# Patient Record
Sex: Male | Born: 2000 | Race: Black or African American | Hispanic: No | Marital: Single | Smoking: Never smoker
Health system: Southern US, Community
[De-identification: ages and names within clinical notes are randomized; demographics above are authoritative.]

---
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
## 2020-09-06 ENCOUNTER — Ambulatory Visit
Admission: EM | Admit: 2020-09-06 | Discharge: 2020-09-06 | Disposition: A | Discharge status: Home / Self Care | Payer: BC Managed Care – PPO | Attending: Emergency Medicine | Admitting: Emergency Medicine

## 2020-09-06 ENCOUNTER — Encounter: Payer: Self-pay | Admitting: *Deleted

## 2020-09-06 DIAGNOSIS — J069 Acute upper respiratory infection, unspecified: Secondary | ICD-10-CM

## 2020-09-06 DIAGNOSIS — J029 Acute pharyngitis, unspecified: Secondary | ICD-10-CM

## 2020-09-06 LAB — POCT RAPID STREP A (OFFICE): Rapid Strep A Screen: NEGATIVE

## 2020-09-06 NOTE — Discharge Instructions (Addendum)
Your rapid strep test is negative.  A throat culture is pending; we will call you if it is positive requiring treatment.    Take Tylenol or ibuprofen as needed for discomfort.  Take Robitussin as needed for cough.  Follow up with your primary care provider if your symptoms are not improving.

## 2020-09-06 NOTE — ED Triage Notes (Signed)
Patient reports sore throat x 1 day. Denies nasal congestion or drainage. Denies fever. Reports intermittent cough.

## 2020-09-06 NOTE — ED Provider Notes (Addendum)
Renaldo Fiddler    CSN: [id number] Arrival date & time: 09/06/20  0847      History   Chief Complaint Chief Complaint  Patient presents with  . Sore Throat    HPI Jerry Melton is a 19 y.o. male.   Patient presents with a mild sore throat and cough since yesterday.  He denies fever, chills, rash, nasal congestion, shortness of breath, vomiting, diarrhea, or other symptoms.  Treatment at home yesterday with Tylenol; none today.  He denies pertinent medical history.  The history is provided by the patient.    History reviewed. No pertinent past medical history.  There are no problems to display for this patient.   History reviewed. No pertinent surgical history.     Home Medications    Prior to Admission medications   Not on File    Family History History reviewed. No pertinent family history.  Social History Social History   Tobacco Use  . Smoking status: Never Smoker  . Smokeless tobacco: Never Used  Substance Use Topics  . Alcohol use: Not on file  . Drug use: Not on file     Allergies   Patient has no known allergies.   Review of Systems Review of Systems  Constitutional: Negative for chills and fever.  HENT: Positive for sore throat. Negative for ear pain.   Eyes: Negative for pain and visual disturbance.  Respiratory: Positive for cough. Negative for shortness of breath.   Cardiovascular: Negative for chest pain and palpitations.  Gastrointestinal: Negative for abdominal pain, diarrhea, nausea and vomiting.  Genitourinary: Negative for dysuria and hematuria.  Musculoskeletal: Negative for arthralgias and back pain.  Skin: Negative for color change and rash.  Neurological: Negative for seizures and syncope.  All other systems reviewed and are negative.    Physical Exam Triage Vital Signs ED Triage Vitals  Enc Vitals Group     BP      Pulse      Resp      Temp      Temp src      SpO2      Weight      Height      Head  Circumference      Peak Flow      Pain Score      Pain Loc      Pain Edu?      Excl. in GC?    No data found.  Updated Vital Signs BP 117/73 (BP Location: Right Arm)   Pulse 72   Temp 98.9 F (37.2 C) (Oral)   Resp 16   SpO2 97%   Visual Acuity Right Eye Distance:   Left Eye Distance:   Bilateral Distance:    Right Eye Near:   Left Eye Near:    Bilateral Near:     Physical Exam Vitals and nursing note reviewed.  Constitutional:      General: He is not in acute distress.    Appearance: He is well-developed. He is not ill-appearing.  HENT:     Head: Normocephalic and atraumatic.     Right Ear: Tympanic membrane normal.     Left Ear: Tympanic membrane normal.     Nose: Nose normal.     Mouth/Throat:     Mouth: Mucous membranes are moist.     Pharynx: Oropharynx is clear.  Eyes:     Conjunctiva/sclera: Conjunctivae normal.  Cardiovascular:     Rate and Rhythm: Normal rate and regular rhythm.  Heart sounds: Normal heart sounds.  Pulmonary:     Effort: Pulmonary effort is normal. No respiratory distress.     Breath sounds: Normal breath sounds. No wheezing or rhonchi.  Abdominal:     Palpations: Abdomen is soft.     Tenderness: There is no abdominal tenderness. There is no guarding or rebound.  Musculoskeletal:     Cervical back: Neck supple.  Skin:    General: Skin is warm and dry.     Findings: No rash.  Neurological:     General: No focal deficit present.     Mental Status: He is alert and oriented to person, place, and time.     Gait: Gait normal.  Psychiatric:        Mood and Affect: Mood normal.        Behavior: Behavior normal.      UC Treatments / Results  Labs (all labs ordered are listed, but only abnormal results are displayed) Labs Reviewed  NOVEL CORONAVIRUS, NAA  POCT RAPID STREP A (OFFICE)    EKG   Radiology No results found.  Procedures Procedures (including critical care time)  Medications Ordered in UC Medications - No  data to display  Initial Impression / Assessment and Plan / UC Course  I have reviewed the triage vital signs and the nursing notes.  Pertinent labs & imaging results that were available during my care of the patient were reviewed by me and considered in my medical decision making (see chart for details).   Sore throat, viral URI.  Rapid strep negative; culture pending.  PCR COVID pending. Instructed patient to self quarantine until the test result is back.  Treating symptomatically with Tylenol or ibuprofen as needed for fever or discomfort; Robitussin as needed for cough.  Instructed patient to follow-up with his PCP if his symptoms are not improving.  Patient agrees to plan of care.   Final Clinical Impressions(s) / UC Diagnoses   Final diagnoses:  Sore throat  Viral upper respiratory tract infection     Discharge Instructions     Your rapid strep test is negative.  A throat culture is pending; we will call you if it is positive requiring treatment.    Take Tylenol or ibuprofen as needed for discomfort.  Take Robitussin as needed for cough.  Follow up with your primary care provider if your symptoms are not improving.       ED Prescriptions    None     PDMP not reviewed this encounter.   Mickie Bail, NP 09/06/20 0920    Mickie Bail, NP 09/06/20 1000

## 2020-09-07 LAB — SARS-COV-2, NAA 2 DAY TAT

## 2020-09-07 LAB — NOVEL CORONAVIRUS, NAA: SARS-CoV-2, NAA: NOT DETECTED
